# Patient Record
Sex: Male | Born: 1975 | Race: White | Hispanic: No | Marital: Single | Smoking: Current every day smoker
Health system: Southern US, Community
[De-identification: ages and names within clinical notes are randomized; demographics above are authoritative.]

---

## 2009-11-01 ENCOUNTER — Inpatient Hospital Stay (HOSPITAL_COMMUNITY): Admission: EM | Admit: 2009-11-01 | Discharge: 2009-11-05 | Payer: Self-pay | Admitting: Emergency Medicine

## 2010-05-08 LAB — LIPID PANEL
Total CHOL/HDL Ratio: 3.4 RATIO
VLDL: 20 mg/dL (ref 0–40)

## 2010-05-08 LAB — DIFFERENTIAL
Basophils Absolute: 0 10*3/uL (ref 0.0–0.1)
Basophils Absolute: 0 10*3/uL (ref 0.0–0.1)
Basophils Relative: 0 % (ref 0–1)
Eosinophils Absolute: 0 10*3/uL (ref 0.0–0.7)
Eosinophils Absolute: 0 10*3/uL (ref 0.0–0.7)
Eosinophils Absolute: 0 10*3/uL (ref 0.0–0.7)
Eosinophils Relative: 0 % (ref 0–5)
Eosinophils Relative: 0 % (ref 0–5)
Eosinophils Relative: 0 % (ref 0–5)
Eosinophils Relative: 1 % (ref 0–5)
Lymphocytes Relative: 3 % — ABNORMAL LOW (ref 12–46)
Lymphocytes Relative: 4 % — ABNORMAL LOW (ref 12–46)
Lymphs Abs: 0.5 10*3/uL — ABNORMAL LOW (ref 0.7–4.0)
Lymphs Abs: 0.6 10*3/uL — ABNORMAL LOW (ref 0.7–4.0)
Lymphs Abs: 0.7 10*3/uL (ref 0.7–4.0)
Monocytes Absolute: 0.1 10*3/uL (ref 0.1–1.0)
Monocytes Absolute: 0.4 10*3/uL (ref 0.1–1.0)
Monocytes Absolute: 1 10*3/uL (ref 0.1–1.0)
Monocytes Relative: 2 % — ABNORMAL LOW (ref 3–12)
Monocytes Relative: 2 % — ABNORMAL LOW (ref 3–12)
Neutro Abs: 13.5 10*3/uL — ABNORMAL HIGH (ref 1.7–7.7)

## 2010-05-08 LAB — BASIC METABOLIC PANEL
BUN: 6 mg/dL (ref 6–23)
CO2: 24 mEq/L (ref 19–32)
CO2: 25 mEq/L (ref 19–32)
Chloride: 106 mEq/L (ref 96–112)
Chloride: 109 mEq/L (ref 96–112)
Creatinine, Ser: 0.82 mg/dL (ref 0.4–1.5)
GFR calc Af Amer: >60 mL/min (ref >60)
GFR calc Af Amer: >60 mL/min (ref >60)
GFR calc non Af Amer: >60 mL/min (ref >60)
Glucose, Bld: 99 mg/dL (ref 70–99)
Potassium: 3.6 mEq/L (ref 3.5–5.1)
Potassium: 4 mEq/L (ref 3.5–5.1)
Potassium: 4.3 mEq/L (ref 3.5–5.1)
Sodium: 138 mEq/L (ref 135–145)

## 2010-05-08 LAB — CBC
HCT: 41 % (ref 39.0–52.0)
HCT: 44.1 % (ref 39.0–52.0)
HCT: 49.5 % (ref 39.0–52.0)
Hemoglobin: 14 g/dL (ref 13.0–17.0)
Hemoglobin: 15.1 g/dL (ref 13.0–17.0)
Hemoglobin: 15.4 g/dL (ref 13.0–17.0)
MCH: 32.4 pg (ref 26.0–34.0)
MCH: 32.5 pg (ref 26.0–34.0)
MCHC: 34.3 g/dL (ref 30.0–36.0)
MCV: 94.4 fL (ref 78.0–100.0)
MCV: 95.3 fL (ref 78.0–100.0)
Platelets: 214 10*3/uL (ref 150–400)
Platelets: 232 10*3/uL (ref 150–400)
RBC: 4.31 MIL/uL (ref 4.22–5.81)
RBC: 4.68 MIL/uL (ref 4.22–5.81)
RBC: 4.81 MIL/uL (ref 4.22–5.81)
RDW: 13.6 % (ref 11.5–15.5)
WBC: 11.1 10*3/uL — ABNORMAL HIGH (ref 4.0–10.5)
WBC: 18.5 10*3/uL — ABNORMAL HIGH (ref 4.0–10.5)
WBC: 19.7 10*3/uL — ABNORMAL HIGH (ref 4.0–10.5)

## 2010-05-08 LAB — GLUCOSE, CAPILLARY
Glucose-Capillary: 127 mg/dL — ABNORMAL HIGH (ref 70–99)
Glucose-Capillary: 138 mg/dL — ABNORMAL HIGH (ref 70–99)
Glucose-Capillary: 144 mg/dL — ABNORMAL HIGH (ref 70–99)
Glucose-Capillary: 151 mg/dL — ABNORMAL HIGH (ref 70–99)

## 2010-05-08 LAB — COMPREHENSIVE METABOLIC PANEL
ALT: 25 U/L (ref 0–53)
AST: 27 U/L (ref 0–37)
Albumin: 3.6 g/dL (ref 3.5–5.2)
CO2: 23 mEq/L (ref 19–32)
Chloride: 104 mEq/L (ref 96–112)
GFR calc Af Amer: >60 mL/min (ref >60)
GFR calc non Af Amer: >60 mL/min (ref >60)
Sodium: 134 mEq/L — ABNORMAL LOW (ref 135–145)
Total Bilirubin: 0.7 mg/dL (ref 0.3–1.2)

## 2010-05-08 LAB — TSH: TSH: 2.849 u[IU]/mL (ref 0.350–4.500)

## 2010-05-08 LAB — POCT CARDIAC MARKERS: Troponin i, poc: <0.05 ng/mL (ref 0.00–0.09)

## 2010-05-08 LAB — RAPID URINE DRUG SCREEN, HOSP PERFORMED
Barbiturates: NOT DETECTED
Benzodiazepines: POSITIVE — AB
Cocaine: NOT DETECTED
Opiates: NOT DETECTED

## 2013-10-24 ENCOUNTER — Emergency Department (HOSPITAL_COMMUNITY)
Admission: EM | Admit: 2013-10-24 | Discharge: 2013-10-24 | Disposition: A | Discharge status: Home / Self Care | Payer: Self-pay | Attending: Emergency Medicine | Admitting: Emergency Medicine

## 2013-10-24 ENCOUNTER — Encounter (HOSPITAL_COMMUNITY): Payer: Self-pay | Admitting: Emergency Medicine

## 2013-10-24 DIAGNOSIS — K089 Disorder of teeth and supporting structures, unspecified: Secondary | ICD-10-CM | POA: Insufficient documentation

## 2013-10-24 DIAGNOSIS — K006 Disturbances in tooth eruption: Secondary | ICD-10-CM | POA: Insufficient documentation

## 2013-10-24 DIAGNOSIS — K029 Dental caries, unspecified: Secondary | ICD-10-CM | POA: Insufficient documentation

## 2013-10-24 DIAGNOSIS — F17200 Nicotine dependence, unspecified, uncomplicated: Secondary | ICD-10-CM | POA: Insufficient documentation

## 2013-10-24 DIAGNOSIS — K0889 Other specified disorders of teeth and supporting structures: Secondary | ICD-10-CM

## 2013-10-24 MED ORDER — CLINDAMYCIN HCL 150 MG PO CAPS
300.0000 mg | ORAL_CAPSULE | Freq: Once | ORAL | Status: AC
  Administered 2013-10-24: 300 mg via ORAL
  Filled 2013-10-24: qty 2

## 2013-10-24 MED ORDER — CLINDAMYCIN HCL 150 MG PO CAPS: 150.0000 mg | ORAL_CAPSULE | Freq: Three times a day (TID) | ORAL | Status: DC

## 2013-10-24 MED ORDER — TRAMADOL HCL 50 MG PO TABS: ORAL_TABLET | ORAL | Status: DC

## 2013-10-24 MED ORDER — IBUPROFEN 800 MG PO TABS
800.0000 mg | ORAL_TABLET | Freq: Once | ORAL | Status: AC
  Administered 2013-10-24: 800 mg via ORAL
  Filled 2013-10-24: qty 1

## 2013-10-24 MED ORDER — ACETAMINOPHEN 325 MG PO TABS
650.0000 mg | ORAL_TABLET | Freq: Once | ORAL | Status: AC
  Administered 2013-10-24: 650 mg via ORAL
  Filled 2013-10-24: qty 2

## 2013-10-24 NOTE — ED Notes (Signed)
Dental pain rt mandibular region up into lt ear. For 2 weeks

## 2013-10-24 NOTE — ED Notes (Signed)
Pt c/o of pain lower right jaw with radiation to right jaw. Patient has broken wisdom tooth on that side which he states happened "around 5 years ago", patient alsostates the pain is not from that tooth but "the one in front of it, with the hole", cavity is seen on assessment. Patient states no dental care, he just "needs antibiotics and pain medication". Patient states Tmax 99, denies any other S&S of infection.

## 2013-10-24 NOTE — Care Management Note (Signed)
ED/CM noted patient did not have health insurance and/or PCP listed in the computer.  Patient was given the [place] resource handout with information on the clinics, food pantries, and the handout for new health insurance sign-up.  Patient expressed appreciation for information received. Pt was also given a Rx discount card.   

## 2013-10-24 NOTE — ED Provider Notes (Signed)
CSN: [id number]     Arrival date & time 10/24/13  1147 History   First MD Initiated Contact with Patient 10/24/13 1205     Chief Complaint  Patient presents with  . Dental Pain     (Consider location/radiation/quality/duration/timing/severity/associated sxs/prior Treatment) Patient is a 38 y.o. male presenting with tooth pain. The history is provided by the patient.  Dental Pain Location:  Lower Lower teeth location:  32/RL 3rd molar Quality:  Shooting and throbbing Severity:  Severe Onset quality:  Gradual Duration:  2 weeks Timing:  Intermittent Progression:  Worsening Chronicity:  Recurrent Context: dental caries and poor dentition   Context: not trauma   Relieved by:  Nothing Ineffective treatments:  NSAIDs Associated symptoms: facial pain and gum swelling   Associated symptoms: no fever, no neck pain and no oral bleeding   Risk factors: lack of dental care     History reviewed. No pertinent past medical history. History reviewed. No pertinent past surgical history. History reviewed. No pertinent family history. History  Substance Use Topics  . Smoking status: Current Every Day Smoker  . Smokeless tobacco: Not on file  . Alcohol Use: Yes    Review of Systems  Constitutional: Negative for fever and activity change.       All ROS Neg except as noted in HPI  HENT: Positive for dental problem. Negative for nosebleeds.   Eyes: Negative for photophobia and discharge.  Respiratory: Negative for cough, shortness of breath and wheezing.   Cardiovascular: Negative for chest pain and palpitations.  Gastrointestinal: Negative for abdominal pain and blood in stool.  Genitourinary: Negative for dysuria, frequency and hematuria.  Musculoskeletal: Negative for arthralgias, back pain and neck pain.  Skin: Negative.   Neurological: Negative for dizziness, seizures and speech difficulty.  Psychiatric/Behavioral: Negative for hallucinations and confusion.      Allergies   Review of patient's allergies indicates no known allergies.  Home Medications   Prior to Admission medications   Medication Sig Start Date End Date Taking? Authorizing Provider  ibuprofen (ADVIL,MOTRIN) 200 MG tablet Take 600 mg by mouth every 6 (six) hours as needed (dental pain).   Yes Historical Provider, MD   BP 108/77  Pulse 91  Temp(Src) 99 F (37.2 C) (Oral)  Resp 18  Ht  (1.702 m)  Wt 250 lb (113.399 kg)  BMI 39.15 kg/m2  SpO2 97% Physical Exam  Nursing note and vitals reviewed. Constitutional: He is oriented to person, place, and time. He appears well-developed and well-nourished.  Non-toxic appearance.  HENT:  Head: Normocephalic.  Right Ear: Tympanic membrane and external ear normal.  Left Ear: Tympanic membrane and external ear normal.  The right lower molar is decayed to the gum line. There is a portion of the tooth that is erupting through the gum. There is no visible abscess. There is no swelling under the tongue. The airway is patent.  Eyes: EOM and lids are normal. Pupils are equal, round, and reactive to light.  Neck: Normal range of motion. Neck supple. Carotid bruit is not present.  Cardiovascular: Normal rate, regular rhythm, normal heart sounds, intact distal pulses and normal pulses.   Pulmonary/Chest: Breath sounds normal. No respiratory distress.  Abdominal: Soft. Bowel sounds are normal. There is no tenderness. There is no guarding.  Musculoskeletal: Normal range of motion.  Lymphadenopathy:       Head (right side): No submandibular adenopathy present.       Head (left side): No submandibular adenopathy present.  He has no cervical adenopathy.  Neurological: He is alert and oriented to person, place, and time. He has normal strength. No cranial nerve deficit or sensory deficit.  Skin: Skin is warm and dry.  Psychiatric: He has a normal mood and affect. His speech is normal.    ED Course  Procedures (including critical care time) Labs  Review Labs Reviewed - No data to display  Imaging Review No results found.   EKG Interpretation None      MDM No evidence for Ludwig's angina. Temperature 99.9, vital signs otherwise within normal limits. The airway is patent.  Prescription for clindamycin and Ultram given to the patient. Patient is to see a dentist as soon as possible. Dental resources sheet given to the patient.    Final diagnoses:  None    *I have reviewed nursing notes, vital signs, and all appropriate lab and imaging results for this patient.Kathie Dike, PA-C 10/24/13 1434

## 2013-10-24 NOTE — Discharge Instructions (Signed)

## 2013-10-24 NOTE — ED Provider Notes (Signed)
Medical screening examination/treatment/procedure(s) were performed by non-physician practitioner and as supervising physician I was immediately available for consultation/collaboration.   EKG Interpretation None        Courtney Fenlon L Davaun Quintela, MD 10/24/13 1456 

## 2013-10-24 NOTE — ED Notes (Signed)
Information regarding free/reduced cost dental care given to patient.

## 2014-08-30 ENCOUNTER — Emergency Department (HOSPITAL_COMMUNITY): Payer: Self-pay

## 2014-08-30 ENCOUNTER — Emergency Department (HOSPITAL_COMMUNITY)
Admission: EM | Admit: 2014-08-30 | Discharge: 2014-08-30 | Disposition: A | Discharge status: Home / Self Care | Payer: Self-pay | Attending: Emergency Medicine | Admitting: Emergency Medicine

## 2014-08-30 ENCOUNTER — Encounter (HOSPITAL_COMMUNITY): Payer: Self-pay | Admitting: Emergency Medicine

## 2014-08-30 DIAGNOSIS — J40 Bronchitis, not specified as acute or chronic: Secondary | ICD-10-CM

## 2014-08-30 DIAGNOSIS — J4541 Moderate persistent asthma with (acute) exacerbation: Secondary | ICD-10-CM | POA: Insufficient documentation

## 2014-08-30 DIAGNOSIS — Z720 Tobacco use: Secondary | ICD-10-CM | POA: Insufficient documentation

## 2014-08-30 LAB — COMPREHENSIVE METABOLIC PANEL
ALK PHOS: 67 U/L (ref 38–126)
ALT: 26 U/L (ref 17–63)
ANION GAP: 10 (ref 5–15)
AST: 28 U/L (ref 15–41)
Albumin: 4.2 g/dL (ref 3.5–5.0)
BILIRUBIN TOTAL: 0.8 mg/dL (ref 0.3–1.2)
BUN: 7 mg/dL (ref 6–20)
CHLORIDE: 105 mmol/L (ref 101–111)
CO2: 23 mmol/L (ref 22–32)
Calcium: 8.7 mg/dL — ABNORMAL LOW (ref 8.9–10.3)
Creatinine, Ser: 1.04 mg/dL (ref 0.61–1.24)
GFR calc Af Amer: >60 mL/min (ref >60)
GFR calc non Af Amer: >60 mL/min (ref >60)
GLUCOSE: 101 mg/dL — AB (ref 65–99)
POTASSIUM: 3.6 mmol/L (ref 3.5–5.1)
Sodium: 138 mmol/L (ref 135–145)
TOTAL PROTEIN: 7.4 g/dL (ref 6.5–8.1)

## 2014-08-30 LAB — CBC WITH DIFFERENTIAL/PLATELET
BASOS PCT: 1 % (ref 0–1)
Basophils Absolute: 0 10*3/uL (ref 0.0–0.1)
Eosinophils Absolute: 0.1 10*3/uL (ref 0.0–0.7)
Eosinophils Relative: 3 % (ref 0–5)
HEMATOCRIT: 49.8 % (ref 39.0–52.0)
HEMOGLOBIN: 17.6 g/dL — AB (ref 13.0–17.0)
LYMPHS ABS: 1.8 10*3/uL (ref 0.7–4.0)
Lymphocytes Relative: 31 % (ref 12–46)
MCH: 31.5 pg (ref 26.0–34.0)
MCHC: 35.3 g/dL (ref 30.0–36.0)
MCV: 89.1 fL (ref 78.0–100.0)
MONO ABS: 0.7 10*3/uL (ref 0.1–1.0)
MONOS PCT: 12 % (ref 3–12)
NEUTROS ABS: 3 10*3/uL (ref 1.7–7.7)
Neutrophils Relative %: 53 % (ref 43–77)
Platelets: 186 10*3/uL (ref 150–400)
RBC: 5.59 MIL/uL (ref 4.22–5.81)
RDW: 12.9 % (ref 11.5–15.5)
WBC: 5.6 10*3/uL (ref 4.0–10.5)

## 2014-08-30 MED ORDER — PREDNISONE 10 MG PO TABS
60.0000 mg | ORAL_TABLET | Freq: Every day | ORAL | Status: DC
  Administered 2014-08-30: 60 mg via ORAL
  Filled 2014-08-30 (×2): qty 1

## 2014-08-30 MED ORDER — ALBUTEROL SULFATE (2.5 MG/3ML) 0.083% IN NEBU
5.0000 mg | INHALATION_SOLUTION | Freq: Once | RESPIRATORY_TRACT | Status: AC
  Administered 2014-08-30: 5 mg via RESPIRATORY_TRACT
  Filled 2014-08-30: qty 6

## 2014-08-30 MED ORDER — AZITHROMYCIN 250 MG PO TABS: ORAL_TABLET | ORAL | Status: DC

## 2014-08-30 MED ORDER — ALBUTEROL (5 MG/ML) CONTINUOUS INHALATION SOLN
10.0000 mg/h | INHALATION_SOLUTION | Freq: Once | RESPIRATORY_TRACT | Status: AC
  Administered 2014-08-30: 10 mg/h via RESPIRATORY_TRACT
  Filled 2014-08-30: qty 20

## 2014-08-30 MED ORDER — METHYLPREDNISOLONE SODIUM SUCC 125 MG IJ SOLR
125.0000 mg | Freq: Once | INTRAMUSCULAR | Status: AC
  Administered 2014-08-30: 125 mg via INTRAVENOUS
  Filled 2014-08-30: qty 2

## 2014-08-30 MED ORDER — SODIUM CHLORIDE 0.9 % IV SOLN
Freq: Once | INTRAVENOUS | Status: AC
  Administered 2014-08-30: 16:00:00 via INTRAVENOUS

## 2014-08-30 MED ORDER — IPRATROPIUM-ALBUTEROL 0.5-2.5 (3) MG/3ML IN SOLN
3.0000 mL | Freq: Once | RESPIRATORY_TRACT | Status: AC
  Administered 2014-08-30: 3 mL via RESPIRATORY_TRACT
  Filled 2014-08-30: qty 3

## 2014-08-30 MED ORDER — ALBUTEROL SULFATE HFA 108 (90 BASE) MCG/ACT IN AERS: 2.0000 | INHALATION_SPRAY | RESPIRATORY_TRACT | Status: DC | PRN

## 2014-08-30 MED ORDER — PREDNISONE 10 MG PO TABS: ORAL_TABLET | ORAL | Status: DC

## 2014-08-30 NOTE — Discharge Instructions (Signed)
Asthma Attack Prevention °Although there is no way to prevent asthma from starting, you can take steps to control the disease and reduce its symptoms. Learn about your asthma and how to control it. Take an active role to control your asthma by working with your health care provider to create and follow an asthma action plan. An asthma action plan guides you in: °· Taking your medicines properly. °· Avoiding things that set off your asthma or make your asthma worse (asthma triggers). °· Tracking your level of asthma control. °· Responding to worsening asthma. °· Seeking emergency care when needed. °To track your asthma, keep records of your symptoms, check your peak flow number using a handheld device that shows how well air moves out of your lungs (peak flow meter), and get regular asthma checkups.  °WHAT ARE SOME WAYS TO PREVENT AN ASTHMA ATTACK? °· Take medicines as directed by your health care provider. °· Keep track of your asthma symptoms and level of control. °· With your health care provider, write a detailed plan for taking medicines and managing an asthma attack. Then be sure to follow your action plan. Asthma is an ongoing condition that needs regular monitoring and treatment. °· Identify and avoid asthma triggers. Many outdoor allergens and irritants (such as pollen, mold, cold air, and air pollution) can trigger asthma attacks. Find out what your asthma triggers are and take steps to avoid them. °· Monitor your breathing. Learn to recognize warning signs of an attack, such as coughing, wheezing, or shortness of breath. Your lung function may decrease before you notice any signs or symptoms, so regularly measure and record your peak airflow with a home peak flow meter. °· Identify and treat attacks early. If you act quickly, you are less likely to have a severe attack. You will also need less medicine to control your symptoms. When your peak flow measurements decrease and alert you to an upcoming attack,  take your medicine as instructed and immediately stop any activity that may have triggered the attack. If your symptoms do not improve, get medical help. °· Pay attention to increasing quick-relief inhaler use. If you find yourself relying on your quick-relief inhaler, your asthma is not under control. See your health care provider about adjusting your treatment. °WHAT CAN MAKE MY SYMPTOMS WORSE? °A number of common things can set off or make your asthma symptoms worse and cause temporary increased inflammation of your airways. Keep track of your asthma symptoms for several weeks, detailing all the environmental and emotional factors that are linked with your asthma. When you have an asthma attack, go back to your asthma diary to see which factor, or combination of factors, might have contributed to it. Once you know what these factors are, you can take steps to control many of them. If you have allergies and asthma, it is important to take asthma prevention steps at home. Minimizing contact with the substance to which you are allergic will help prevent an asthma attack. Some triggers and ways to avoid these triggers are: °Animal Dander:  °Some people are allergic to the flakes of skin or dried saliva from animals with fur or feathers.  °· There is no such thing as a hypoallergenic dog or cat breed. All dogs or cats can cause allergies, even if they don't shed. °· Keep these pets out of your home. °· If you are not able to keep a pet outdoors, keep the pet out of your bedroom and other sleeping areas at all   times, and keep the door closed. °· Remove carpets and furniture covered with cloth from your home. If that is not possible, keep the pet away from fabric-covered furniture and carpets. °Dust Mites: °Many people with asthma are allergic to dust mites. Dust mites are tiny bugs that are found in every home in mattresses, pillows, carpets, fabric-covered furniture, bedcovers, clothes, stuffed toys, and other  fabric-covered items.  °· Cover your mattress in a special dust-proof cover. °· Cover your pillow in a special dust-proof cover, or wash the pillow each week in hot water. Water must be hotter than 130° F (54.4° C) to kill dust mites. Cold or warm water used with detergent and bleach can also be effective. °· Wash the sheets and blankets on your bed each week in hot water. °· Try not to sleep or lie on cloth-covered cushions. °· Call ahead when traveling and ask for a smoke-free hotel room. Bring your own bedding and pillows in case the hotel only supplies feather pillows and down comforters, which may contain dust mites and cause asthma symptoms. °· Remove carpets from your bedroom and those laid on concrete, if you can. °· Keep stuffed toys out of the bed, or wash the toys weekly in hot water or cooler water with detergent and bleach. °Cockroaches: °Many people with asthma are allergic to the droppings and remains of cockroaches.  °· Keep food and garbage in closed containers. Never leave food out. °· Use poison baits, traps, powders, gels, or paste (for example, boric acid). °· If a spray is used to kill cockroaches, stay out of the room until the odor goes away. °Indoor Mold: °· Fix leaky faucets, pipes, or other sources of water that have mold around them. °· Clean floors and moldy surfaces with a fungicide or diluted bleach. °· Avoid using humidifiers, vaporizers, or swamp coolers. These can spread molds through the air. °Pollen and Outdoor Mold: °· When pollen or mold spore counts are high, try to keep your windows closed. °· Stay indoors with windows closed from late morning to afternoon. Pollen and some mold spore counts are highest at that time. °· Ask your health care provider whether you need to take anti-inflammatory medicine or increase your dose of the medicine before your allergy season starts. °Other Irritants to Avoid: °· Tobacco smoke is an irritant. If you smoke, ask your health care provider how  you can quit. Ask family members to quit smoking, too. Do not allow smoking in your home or car. °· If possible, do not use a wood-burning stove, kerosene heater, or fireplace. Minimize exposure to all sources of smoke, including incense, candles, fires, and fireworks. °· Try to stay away from strong odors and sprays, such as perfume, talcum powder, hair spray, and paints. °· Decrease humidity in your home and use an indoor air cleaning device. Reduce indoor humidity to below 60%. Dehumidifiers or central air conditioners can do this. °· Decrease house dust exposure by changing furnace and air cooler filters frequently. °· Try to have someone else vacuum for you once or twice a week. Stay out of rooms while they are being vacuumed and for a short while afterward. °· If you vacuum, use a dust mask from a hardware store, a double-layered or microfilter vacuum cleaner bag, or a vacuum cleaner with a HEPA filter. °· Sulfites in foods and beverages can be irritants. Do not drink beer or wine or eat dried fruit, processed potatoes, or shrimp if they cause asthma symptoms. °· Cold   air can trigger an asthma attack. Cover your nose and mouth with a scarf on cold or windy days. °· Several health conditions can make asthma more difficult to manage, including a runny nose, sinus infections, reflux disease, psychological stress, and sleep apnea. Work with your health care provider to manage these conditions. °· Avoid close contact with people who have a respiratory infection such as a cold or the flu, since your asthma symptoms may get worse if you catch the infection. Wash your hands thoroughly after touching items that may have been handled by people with a respiratory infection. °· Get a flu shot every year to protect against the flu virus, which often makes asthma worse for days or weeks. Also get a pneumonia shot if you have not previously had one. Unlike the flu shot, the pneumonia shot does not need to be given  yearly. °Medicines: °· Talk to your health care provider about whether it is safe for you to take aspirin or non-steroidal anti-inflammatory medicines (NSAIDs). In a small number of people with asthma, aspirin and NSAIDs can cause asthma attacks. These medicines must be avoided by people who have known aspirin-sensitive asthma. It is important that people with aspirin-sensitive asthma read labels of all over-the-counter medicines used to treat pain, colds, coughs, and fever. °· Beta-blockers and ACE inhibitors are other medicines you should discuss with your health care provider. °HOW CAN I FIND OUT WHAT I AM ALLERGIC TO? °Ask your asthma health care provider about allergy skin testing or blood testing (the RAST test) to identify the allergens to which you are sensitive. If you are found to have allergies, the most important thing to do is to try to avoid exposure to any allergens that you are sensitive to as much as possible. Other treatments for allergies, such as medicines and allergy shots (immunotherapy) are available.  °CAN I EXERCISE? °Follow your health care provider's advice regarding asthma treatment before exercising. It is important to maintain a regular exercise program, but vigorous exercise or exercise in cold, humid, or dry environments can cause asthma attacks, especially for those people who have exercise-induced asthma. °Document Released: 01/28/2009 Document Revised: 02/14/2013 Document Reviewed: 08/17/2012 °Exit[employer]® Patient Information ©2015 [employer]. This information is not intended to replace advice given to you by your health care provider. Make sure you discuss any questions you have with your health care provider. ° °Asthma °Asthma is a recurring condition in which the airways tighten and narrow. Asthma can make it difficult to breathe. It can cause coughing, wheezing, and shortness of breath. Asthma episodes, also called asthma attacks, range from minor to life-threatening. Asthma  cannot be cured, but medicines and lifestyle changes can help control it. °CAUSES °Asthma is believed to be caused by inherited (genetic) and environmental factors, but its exact cause is unknown. Asthma may be triggered by allergens, lung infections, or irritants in the air. Asthma triggers are different for each person. Common triggers include:  °· Animal dander. °· Dust mites. °· Cockroaches. °· Pollen from trees or grass. °· Mold. °· Smoke. °· Air pollutants such as dust, household cleaners, hair sprays, aerosol sprays, paint fumes, strong chemicals, or strong odors. °· Cold air, weather changes, and winds (which increase molds and pollens in the air). °· Strong emotional expressions such as crying or laughing hard. °· Stress. °· Certain medicines (such as aspirin) or types of drugs (such as beta-blockers). °· Sulfites in foods and drinks. Foods and drinks that may contain sulfites include dried fruit, potato   chips, and sparkling grape juice. °· Infections or inflammatory conditions such as the flu, a cold, or an inflammation of the nasal membranes (rhinitis). °· Gastroesophageal reflux disease (GERD). °· Exercise or strenuous activity. °SYMPTOMS °Symptoms may occur immediately after asthma is triggered or many hours later. Symptoms include: °· Wheezing. °· Excessive nighttime or early morning coughing. °· Frequent or severe coughing with a common cold. °· Chest tightness. °· Shortness of breath. °DIAGNOSIS  °The diagnosis of asthma is made by a review of your medical history and a physical exam. Tests may also be performed. These may include: °· Lung function studies. These tests show how much air you breathe in and out. °· Allergy tests. °· Imaging tests such as X-rays. °TREATMENT  °Asthma cannot be cured, but it can usually be controlled. Treatment involves identifying and avoiding your asthma triggers. It also involves medicines. There are 2 classes of medicine used for asthma treatment:  °· Controller  medicines. These prevent asthma symptoms from occurring. They are usually taken every day. °· Reliever or rescue medicines. These quickly relieve asthma symptoms. They are used as needed and provide short-term relief. °Your health care provider will help you create an asthma action plan. An asthma action plan is a written plan for managing and treating your asthma attacks. It includes a list of your asthma triggers and how they may be avoided. It also includes information on when medicines should be taken and when their dosage should be changed. An action plan may also involve the use of a device called a peak flow meter. A peak flow meter measures how well the lungs are working. It helps you monitor your condition. °HOME CARE INSTRUCTIONS  °· Take medicines only as directed by your health care provider. Speak with your health care provider if you have questions about how or when to take the medicines. °· Use a peak flow meter as directed by your health care provider. Record and keep track of readings. °· Understand and use the action plan to help minimize or stop an asthma attack without needing to seek medical care. °· Control your home environment in the following ways to help prevent asthma attacks: °¨ Do not smoke. Avoid being exposed to secondhand smoke. °¨ Change your heating and air conditioning filter regularly. °¨ Limit your use of fireplaces and wood stoves. °¨ Get rid of pests (such as roaches and mice) and their droppings. °¨ Throw away plants if you see mold on them. °¨ Clean your floors and dust regularly. Use unscented cleaning products. °¨ Try to have someone else vacuum for you regularly. Stay out of rooms while they are being vacuumed and for a short while afterward. If you vacuum, use a dust mask from a hardware store, a double-layered or microfilter vacuum cleaner bag, or a vacuum cleaner with a HEPA filter. °¨ Replace carpet with wood, tile, or vinyl flooring. Carpet can trap dander and  dust. °¨ Use allergy-proof pillows, mattress covers, and box spring covers. °¨ Wash bed sheets and blankets every week in hot water and dry them in a dryer. °¨ Use blankets that are made of polyester or cotton. °¨ Clean bathrooms and kitchens with bleach. If possible, have someone repaint the walls in these rooms with mold-resistant paint. Keep out of the rooms that are being cleaned and painted. °¨ Wash hands frequently. °SEEK MEDICAL CARE IF:  °· You have wheezing, shortness of breath, or a cough even if taking medicine to prevent attacks. °· The colored mucus   you cough up (sputum) is thicker than usual. °· Your sputum changes from clear or white to yellow, green, gray, or bloody. °· You have any problems that may be related to the medicines you are taking (such as a rash, itching, swelling, or trouble breathing). °· You are using a reliever medicine more than 2-3 times per week. °· Your peak flow is still at 50-79% of your personal best after following your action plan for 1 hour. °· You have a fever. °SEEK IMMEDIATE MEDICAL CARE IF:  °· You seem to be getting worse and are unresponsive to treatment during an asthma attack. °· You are short of breath even at rest. °· You get short of breath when doing very little physical activity. °· You have difficulty eating, drinking, or talking due to asthma symptoms. °· You develop chest pain. °· You develop a fast heartbeat. °· You have a bluish color to your lips or fingernails. °· You are light-headed, dizzy, or faint. °· Your peak flow is less than 50% of your personal best. °MAKE SURE YOU:  °· Understand these instructions. °· Will watch your condition. °· Will get help right away if you are not doing well or get worse. °Document Released: 02/09/2005 Document Revised: 06/26/2013 Document Reviewed: 09/08/2012 °Exit[employer]® Patient Information ©2015 [employer]. This information is not intended to replace advice given to you by your health care provider. Make sure you  discuss any questions you have with your health care provider. ° °

## 2014-08-30 NOTE — ED Notes (Signed)
Started with cold symptoms July 4th - cough, congestion. Sputum-- starting to turn brownish in color

## 2014-08-30 NOTE — ED Provider Notes (Signed)
CSN: [id number]     Arrival date & time 08/30/14  1130 History   First MD Initiated Contact with Patient 08/30/14 1307     Chief Complaint  Patient presents with  . Cough     (Consider location/radiation/quality/duration/timing/severity/associated sxs/prior Treatment) Patient is a 39 y.o. male presenting with cough. The history is provided by the patient. No language interpreter was used.  Cough Cough characteristics:  Non-productive Severity:  Moderate Onset quality:  Gradual Duration:  3 days Timing:  Constant Progression:  Worsening Chronicity:  New Smoker: no   Context: upper respiratory infection   Context: not sick contacts   Relieved by:  Nothing Worsened by:  Nothing tried Ineffective treatments:  None tried Associated symptoms: shortness of breath     History reviewed. No pertinent past medical history. History reviewed. No pertinent past surgical history. No family history on file. History  Substance Use Topics  . Smoking status: Current Every Day Smoker -- 1.00 packs/day for 1 years    Types: Cigarettes  . Smokeless tobacco: Never Used  . Alcohol Use: Yes     Comment: occ    Review of Systems  Respiratory: Positive for cough and shortness of breath.   All other systems reviewed and are negative.     Allergies  Review of patient's allergies indicates no known allergies.  Home Medications   Prior to Admission medications   Medication Sig Start Date End Date Taking? Authorizing Provider  ibuprofen (ADVIL,MOTRIN) 200 MG tablet Take 600 mg by mouth every 6 (six) hours as needed (dental pain).   Yes Historical Provider, MD   BP 100/67 mmHg  Pulse 87  Temp(Src) 98.1 F (36.7 C) (Oral)  Resp 16  Ht 5\' 6"  (1.676 m)  Wt 210 lb (95.255 kg)  BMI 33.91 kg/m2  SpO2 98% Physical Exam  Constitutional: He is oriented to person, place, and time. He appears well-developed and well-nourished.  HENT:  Head: Normocephalic.  Right Ear: External ear normal.   Left Ear: External ear normal.  Nose: Nose normal.  Mouth/Throat: Oropharynx is clear and moist.  Eyes: Conjunctivae and EOM are normal. Pupils are equal, round, and reactive to light.  Neck: Normal range of motion.  Cardiovascular: Normal rate and normal heart sounds.   Pulmonary/Chest: Effort normal. He has wheezes.  Abdominal: Soft. He exhibits no distension.  Musculoskeletal: Normal range of motion.  Neurological: He is alert and oriented to person, place, and time.  Skin: Skin is warm.  Psychiatric: He has a normal mood and affect.  Nursing note and vitals reviewed.   ED Course  Procedures (including critical care time) Labs Review Labs Reviewed - No data to display  Imaging Review Dg Chest 2 View  08/30/2014   CLINICAL DATA:  Cough starting July 4th  EXAM: CHEST  2 VIEW  COMPARISON:  11/01/2009  FINDINGS: Cardiomediastinal silhouette is stable. No acute infiltrate or pleural effusion. No pulmonary edema. Bony thorax is unremarkable.  IMPRESSION: No active cardiopulmonary disease.   Electronically Signed   By: Natasha MeadLiviu  Pop M.D.   On: 08/30/2014 13:28     EKG Interpretation None      MDM pt given duo neb then 5mg  neb.   Pt has continued wheezing.  Pt given iv solumedrol and 1 hour continous neb.  Pt reports he feels much better.   Final diagnoses:  Asthma, moderate persistent, with acute exacerbation  Bronchitis   Meds ordered this encounter  Medications  . ipratropium-albuterol (DUONEB) 0.5-2.5 (3) MG/3ML nebulizer solution  3 mL    Sig:   . predniSONE (DELTASONE) tablet 60 mg    Sig:   . albuterol (PROVENTIL) (2.5 MG/3ML) 0.083% nebulizer solution 5 mg    Sig:   . 0.9 %  sodium chloride infusion    Sig:   . methylPREDNISolone sodium succinate (SOLU-MEDROL) 125 mg/2 mL injection 125 mg    Sig:   . albuterol (PROVENTIL,VENTOLIN) solution continuous neb    Sig:   . albuterol (PROVENTIL HFA;VENTOLIN HFA) 108 (90 BASE) MCG/ACT inhaler    Sig: Inhale 2 puffs into the  lungs every 4 (four) hours as needed for wheezing or shortness of breath.    Dispense:  1 Inhaler    Refill:  0    Order Specific Question:  Supervising Provider    Answer:  MILLER, BRIAN [[id number]]  . predniSONE (DELTASONE) 10 MG tablet    Sig: 1,6,1,0,96 taper    Dispense:  21 tablet    Refill:  0    Order Specific Question:  Supervising Provider    Answer:  Hyacinth Meeker, BRIAN [[id number]]  . azithromycin (ZITHROMAX Z-PAK) 250 MG tablet    Sig: 2 tablets 1st day then 1 tablet days 2-5    Dispense:  6 tablet    Refill:  0    Order Specific Question:  Supervising Provider    Answer:  Eber Hong [[id number]]    Lonia Skinner [place], PA-C 08/30/14 1702  Bethann Berkshire, MD 08/31/14 [phone]

## 2015-04-30 ENCOUNTER — Emergency Department (HOSPITAL_COMMUNITY)
Admission: EM | Admit: 2015-04-30 | Discharge: 2015-04-30 | Disposition: A | Discharge status: Home / Self Care | Payer: Self-pay | Attending: Emergency Medicine | Admitting: Emergency Medicine

## 2015-04-30 ENCOUNTER — Encounter (HOSPITAL_COMMUNITY): Payer: Self-pay | Admitting: Emergency Medicine

## 2015-04-30 ENCOUNTER — Emergency Department (HOSPITAL_COMMUNITY): Payer: Self-pay

## 2015-04-30 DIAGNOSIS — N201 Calculus of ureter: Secondary | ICD-10-CM | POA: Insufficient documentation

## 2015-04-30 DIAGNOSIS — F17210 Nicotine dependence, cigarettes, uncomplicated: Secondary | ICD-10-CM | POA: Insufficient documentation

## 2015-04-30 LAB — CBC
HEMATOCRIT: 48.4 % (ref 39.0–52.0)
Hemoglobin: 16.9 g/dL (ref 13.0–17.0)
MCH: 31.2 pg (ref 26.0–34.0)
MCHC: 34.9 g/dL (ref 30.0–36.0)
MCV: 89.3 fL (ref 78.0–100.0)
PLATELETS: 283 10*3/uL (ref 150–400)
RBC: 5.42 MIL/uL (ref 4.22–5.81)
RDW: 13.2 % (ref 11.5–15.5)
WBC: 16 10*3/uL — AB (ref 4.0–10.5)

## 2015-04-30 LAB — COMPREHENSIVE METABOLIC PANEL
ALT: 21 U/L (ref 17–63)
ANION GAP: 10 (ref 5–15)
AST: 20 U/L (ref 15–41)
Albumin: 4.4 g/dL (ref 3.5–5.0)
Alkaline Phosphatase: 63 U/L (ref 38–126)
BUN: 11 mg/dL (ref 6–20)
CALCIUM: 9.2 mg/dL (ref 8.9–10.3)
CHLORIDE: 104 mmol/L (ref 101–111)
CO2: 24 mmol/L (ref 22–32)
CREATININE: 1.18 mg/dL (ref 0.61–1.24)
GFR calc non Af Amer: >60 mL/min (ref >60)
Glucose, Bld: 114 mg/dL — ABNORMAL HIGH (ref 65–99)
Potassium: 3.9 mmol/L (ref 3.5–5.1)
SODIUM: 138 mmol/L (ref 135–145)
TOTAL PROTEIN: 7.3 g/dL (ref 6.5–8.1)
Total Bilirubin: 0.5 mg/dL (ref 0.3–1.2)

## 2015-04-30 LAB — URINALYSIS, ROUTINE W REFLEX MICROSCOPIC
BILIRUBIN URINE: NEGATIVE
Glucose, UA: NEGATIVE mg/dL
KETONES UR: NEGATIVE mg/dL
LEUKOCYTES UA: NEGATIVE
NITRITE: NEGATIVE
PH: 6 (ref 5.0–8.0)
PROTEIN: NEGATIVE mg/dL
Specific Gravity, Urine: 1.02 (ref 1.005–1.030)

## 2015-04-30 LAB — LIPASE, BLOOD: LIPASE: 26 U/L (ref 11–51)

## 2015-04-30 LAB — URINE MICROSCOPIC-ADD ON

## 2015-04-30 MED ORDER — CEPHALEXIN 500 MG PO CAPS: 500.0000 mg | ORAL_CAPSULE | Freq: Three times a day (TID) | ORAL | Status: DC

## 2015-04-30 MED ORDER — KETOROLAC TROMETHAMINE 30 MG/ML IJ SOLN
30.0000 mg | Freq: Once | INTRAMUSCULAR | Status: AC
  Administered 2015-04-30: 30 mg via INTRAVENOUS
  Filled 2015-04-30: qty 1

## 2015-04-30 MED ORDER — ONDANSETRON HCL 4 MG/2ML IJ SOLN
4.0000 mg | Freq: Once | INTRAMUSCULAR | Status: AC
  Administered 2015-04-30: 4 mg via INTRAVENOUS
  Filled 2015-04-30: qty 2

## 2015-04-30 MED ORDER — IOHEXOL 300 MG/ML  SOLN
50.0000 mL | Freq: Once | INTRAMUSCULAR | Status: AC | PRN
  Administered 2015-04-30: 50 mL via ORAL

## 2015-04-30 MED ORDER — OXYCODONE-ACETAMINOPHEN 5-325 MG PO TABS: 1.0000 | ORAL_TABLET | ORAL | Status: DC | PRN

## 2015-04-30 MED ORDER — ONDANSETRON 8 MG PO TBDP: 8.0000 mg | ORAL_TABLET | Freq: Three times a day (TID) | ORAL | Status: DC | PRN

## 2015-04-30 MED ORDER — IOHEXOL 300 MG/ML  SOLN
100.0000 mL | Freq: Once | INTRAMUSCULAR | Status: AC | PRN
  Administered 2015-04-30: 100 mL via INTRAVENOUS

## 2015-04-30 MED ORDER — CEFTRIAXONE SODIUM 1 G IJ SOLR
1.0000 g | Freq: Once | INTRAMUSCULAR | Status: AC
  Administered 2015-04-30: 1 g via INTRAVENOUS
  Filled 2015-04-30: qty 10

## 2015-04-30 MED ORDER — HYDROMORPHONE HCL 1 MG/ML IJ SOLN
1.0000 mg | Freq: Once | INTRAMUSCULAR | Status: AC
  Administered 2015-04-30: 1 mg via INTRAVENOUS
  Filled 2015-04-30: qty 1

## 2015-04-30 MED ORDER — IBUPROFEN 600 MG PO TABS: 600.0000 mg | ORAL_TABLET | Freq: Three times a day (TID) | ORAL | Status: DC | PRN

## 2015-04-30 NOTE — ED Notes (Signed)
Pt states NPO for solids 10am - reports he had 4oz water here in waiting room.

## 2015-04-30 NOTE — ED Provider Notes (Signed)
CSN: [id number]     Arrival date & time 04/30/15  1702 History   First MD Initiated Contact with Patient 04/30/15 1809     Chief Complaint  Patient presents with  . Abdominal Pain     HPI Patient reports developing right lower quadrant abdominal pain which is been worsening as the day went on.  He reports chills without documented fever.  Reports nausea without vomiting.  Denies diarrhea.  Denies flank pain.  No dysuria or urinary frequency.  No groin or testicular pain.  Pain is moderate in severity and worse with movement and palpation of his right lower quadrant.   History reviewed. No pertinent past medical history. History reviewed. No pertinent past surgical history. No family history on file. Social History  Substance Use Topics  . Smoking status: Current Every Day Smoker -- 1.00 packs/day for 1 years    Types: Cigarettes  . Smokeless tobacco: Never Used  . Alcohol Use: Yes     Comment: occ    Review of Systems  All other systems reviewed and are negative.     Allergies  Review of patient's allergies indicates no known allergies.  Home Medications   Prior to Admission medications   Medication Sig Start Date End Date Taking? Authorizing Provider  albuterol (PROVENTIL HFA;VENTOLIN HFA) 108 (90 BASE) MCG/ACT inhaler Inhale 2 puffs into the lungs every 4 (four) hours as needed for wheezing or shortness of breath. 08/30/14   Elson AreasLeslie K Sofia, PA-C  azithromycin (ZITHROMAX Z-PAK) 250 MG tablet 2 tablets 1st day then 1 tablet days 2-5 08/30/14   Elson AreasLeslie K Sofia, PA-C  ibuprofen (ADVIL,MOTRIN) 200 MG tablet Take 600 mg by mouth every 6 (six) hours as needed (dental pain).    Historical Provider, MD  predniSONE (DELTASONE) 10 MG tablet 1,9,1,4,786,5,4,3,21 taper 08/30/14   Lonia SkinnerLeslie K Sofia, PA-C   BP 129/82 mmHg  Pulse 92  Temp(Src) 97.7 F (36.5 C) (Oral)  Resp 18  Ht 5\' 6"  (1.676 m)  Wt 210 lb (95.255 kg)  BMI 33.91 kg/m2  SpO2 100% Physical Exam  Constitutional: He is oriented to  person, place, and time. He appears well-developed and well-nourished.  HENT:  Head: Normocephalic and atraumatic.  Eyes: EOM are normal.  Neck: Normal range of motion.  Cardiovascular: Normal rate, regular rhythm, normal heart sounds and intact distal pulses.   Pulmonary/Chest: Effort normal and breath sounds normal. No respiratory distress.  Right lower quadrant tenderness.  No guarding or rebound.  Abdominal: Soft. He exhibits no distension.  Musculoskeletal: Normal range of motion.  Neurological: He is alert and oriented to person, place, and time.  Skin: Skin is warm and dry.  Psychiatric: He has a normal mood and affect. Judgment normal.  Nursing note and vitals reviewed.   ED Course  Procedures (including critical care time) Labs Review Labs Reviewed  COMPREHENSIVE METABOLIC PANEL - Abnormal; Notable for the following:    Glucose, Bld 114 (*)    All other components within normal limits  CBC - Abnormal; Notable for the following:    WBC 16.0 (*)    All other components within normal limits  URINALYSIS, ROUTINE W REFLEX MICROSCOPIC (NOT AT [facility]) - Abnormal; Notable for the following:    APPearance HAZY (*)    Hgb urine dipstick LARGE (*)    All other components within normal limits  URINE MICROSCOPIC-ADD ON - Abnormal; Notable for the following:    Squamous Epithelial / LPF 0-5 (*)    Bacteria, UA MANY (*)  All other components within normal limits  URINE CULTURE  LIPASE, BLOOD    Imaging Review Ct Abdomen Pelvis W Contrast  04/30/2015  CLINICAL DATA:  Right lower quadrant pain EXAM: CT ABDOMEN AND PELVIS WITH CONTRAST TECHNIQUE: Multidetector CT imaging of the abdomen and pelvis was performed using the standard protocol following bolus administration of intravenous contrast. CONTRAST:  50mL OMNIPAQUE IOHEXOL 300 MG/ML SOLN, OMNIPAQUE IOHEXOL 300 MG/ML SOLN COMPARISON:  None. FINDINGS: Lower chest:  Lung bases clear.  Heart size normal. Hepatobiliary: Liver normal  in size and contour. No focal liver lesion. Gallbladder and bile ducts normal. Pancreas: Negative Spleen: Negative Adrenals/Urinary Tract: Normal renal excretion of contrast bilaterally. Negative for renal mass. 2 x 3 mm stone in the mid right ureter at the level of L4-5 causing mild obstruction. No other renal calculi. Urinary bladder normal. Stomach/Bowel: Negative for bowel obstruction. No bowel mass or edema. Normal appendix. Mild sigmoid diverticulosis. Vascular/Lymphatic: Normal aorta and iliac arteries. No aneurysm. Negative for lymphadenopathy. Reproductive: Normal prostate. Other: No free fluid Musculoskeletal: Negative IMPRESSION: 2 x 3 mm stone right mid ureter causing mild obstruction. Normal appendix. Electronically Signed   By: Marlan Palau M.D.   On: 04/30/2015 19:26   I have personally reviewed and evaluated these images and lab results as part of my medical decision-making.   EKG Interpretation None      MDM   Final diagnoses:  Right ureteral stone    Concerning for early appendicitis.  Patient will undergo labs and CT imaging at this time.  Pain treated.  Nothing by mouth.  Mid right ureteral stone.  Patient does have some bacteria although negative for nitrites and negative leukocytes.  Patient will be covered with Rocephin and Keflex.  My suspicion is low that this is truly an infected right ureteral stone.  I suspect right ureteral stone with generalized colic.  Patient understands return the emergency department for new or worsening symptoms.  Outpatient urology follow-up.    Azalia Bilis, MD 04/30/15 2115

## 2015-04-30 NOTE — Discharge Instructions (Signed)
Renal Colic  Renal colic is pain that is caused by passing a kidney stone. The pain can be sharp and severe. It may be felt in the back, abdomen, side (flank), or groin. It can cause nausea. Renal colic can come and go.  HOME CARE INSTRUCTIONS  Watch your condition for any changes. The following actions may help to lessen any discomfort that you are feeling:  · Take medicines only as directed by your health care provider.  · Ask your health care provider if it is okay to take over-the-counter pain medicine.  · Drink enough fluid to keep your urine clear or pale yellow. Drink 6-8 glasses of water each day.  · Limit the amount of salt that you eat to less than 2 grams per day.  · Reduce the amount of protein in your diet. Eat less meat, fish, nuts, and dairy.  · Avoid foods such as spinach, rhubarb, nuts, or bran. These may make kidney stones more likely to form.  SEEK MEDICAL CARE IF:  · You have a fever or chills.  · Your urine smells bad or looks cloudy.  · You have pain or burning when you pass urine.  SEEK IMMEDIATE MEDICAL CARE IF:  · Your flank pain or groin pain suddenly worsens.  · You become confused or disoriented or you lose consciousness.     This information is not intended to replace advice given to you by your health care provider. Make sure you discuss any questions you have with your health care provider.     Document Released: 11/19/2004 Document Revised: 03/02/2014 Document Reviewed: 12/20/2013  Elsevier Interactive Patient Education ©2016 [employer]

## 2015-04-30 NOTE — ED Notes (Signed)
Sudden onset of R side abd pain radiating to groin 1 hour ago. C/o nausea, no vomiting.

## 2015-05-02 LAB — URINE CULTURE: CULTURE: NO GROWTH

## 2016-12-12 IMAGING — CT CT ABD-PELV W/ CM
2 of 4 series · 17 of 46 positions shown, 19 images · IV contrast (omnipaque)
Comparison: None.

CLINICAL DATA: Right lower quadrant pain

EXAM:
CT ABDOMEN AND PELVIS WITH CONTRAST
TECHNIQUE: Multidetector CT imaging of the abdomen and pelvis was performed
using the standard protocol following bolus administration of
intravenous contrast.
CONTRAST:  50mL OMNIPAQUE IOHEXOL 300 MG/ML SOLN, 100mL OMNIPAQUE
IOHEXOL 300 MG/ML SOLN

[Series 2: abd_pel_with 5.0 b40f · axial · 0.87mm/px · z∈[-523,-68]mm · 14 of 101 slices shown, 16 images]
[im 5/101  soft-tissue]
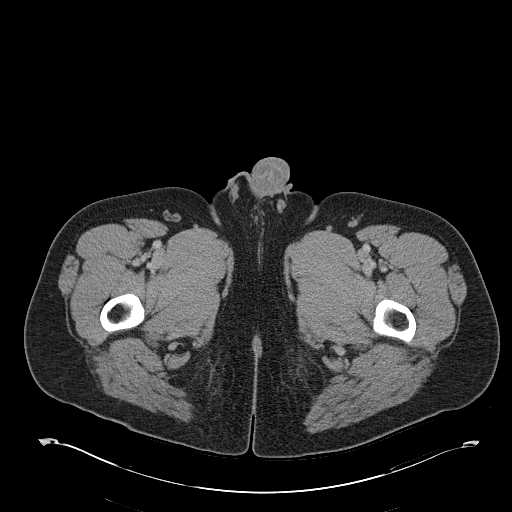
[im 5/101  bone]
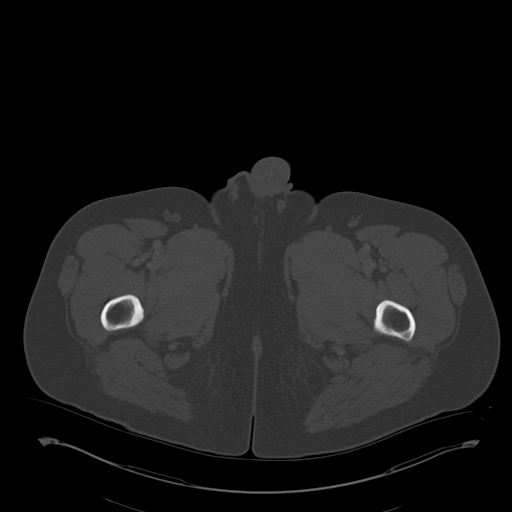
[im 14/101  soft-tissue]
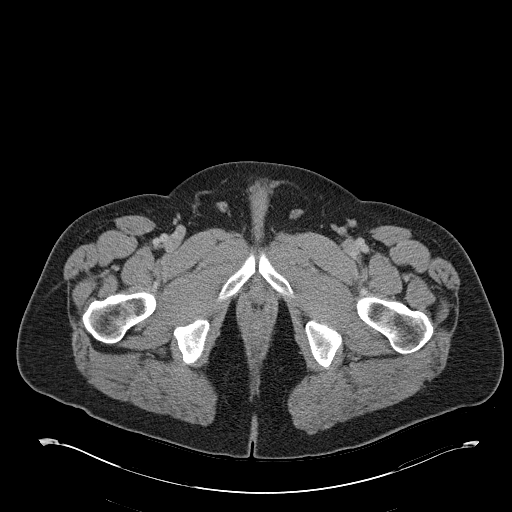
[im 18/101  soft-tissue]
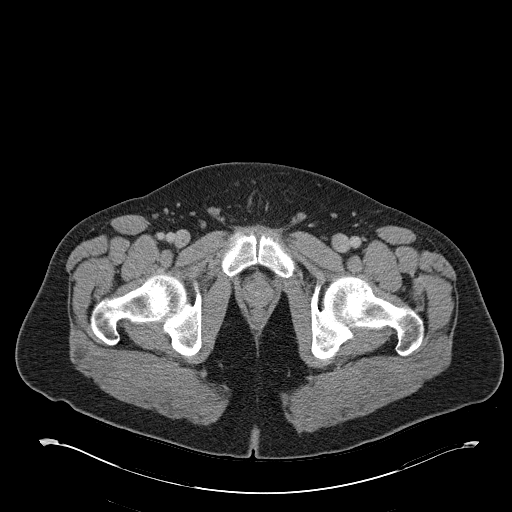
[im 27/101  soft-tissue]
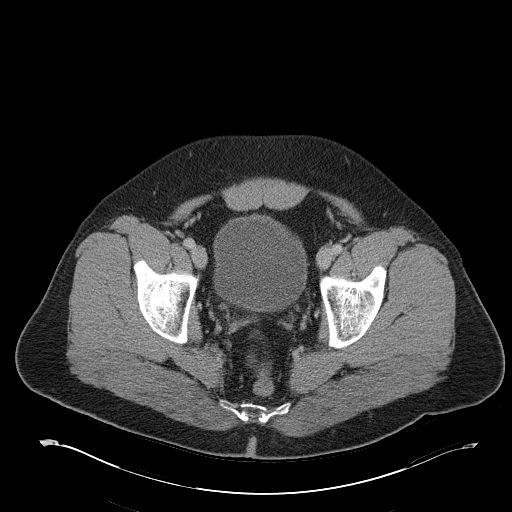
[im 35/101  soft-tissue]
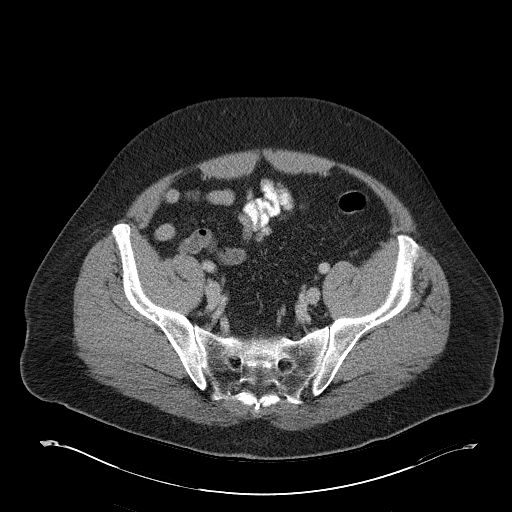
[im 40/101  soft-tissue]
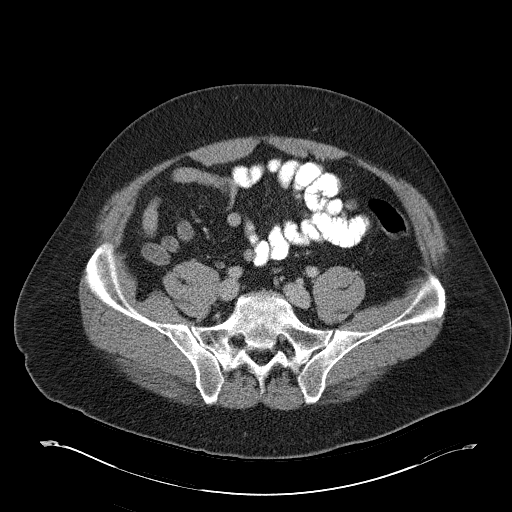
[im 48/101  soft-tissue]
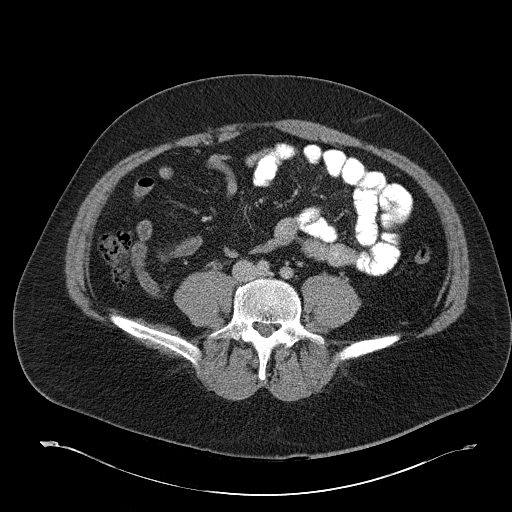
[im 53/101  soft-tissue]
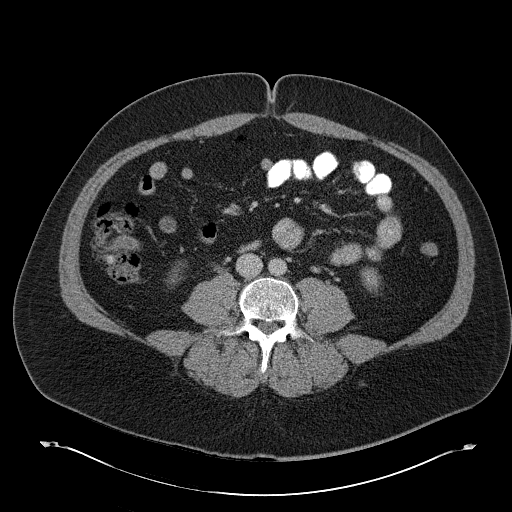
[im 61/101  soft-tissue]
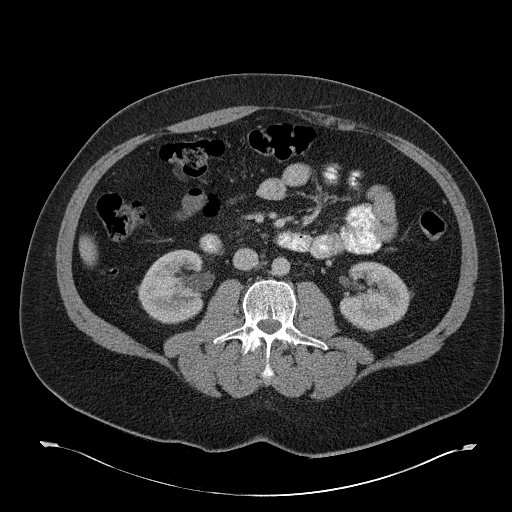
[im 61/101  bone]
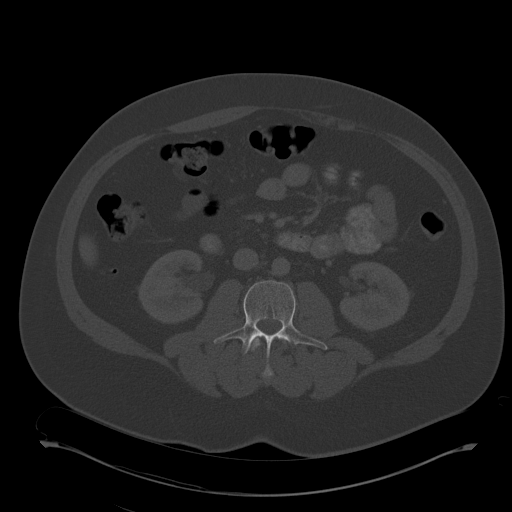
[im 66/101  soft-tissue]
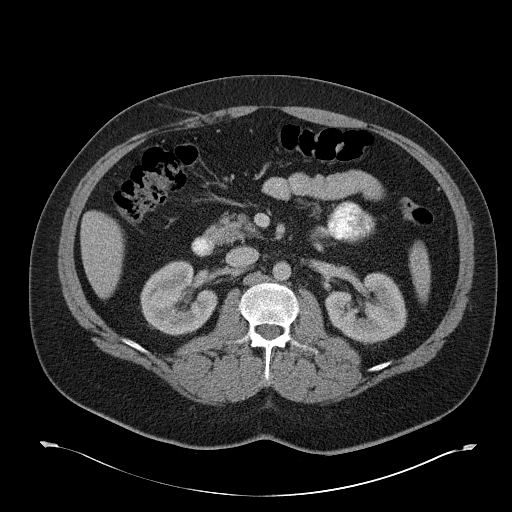
[im 74/101  soft-tissue]
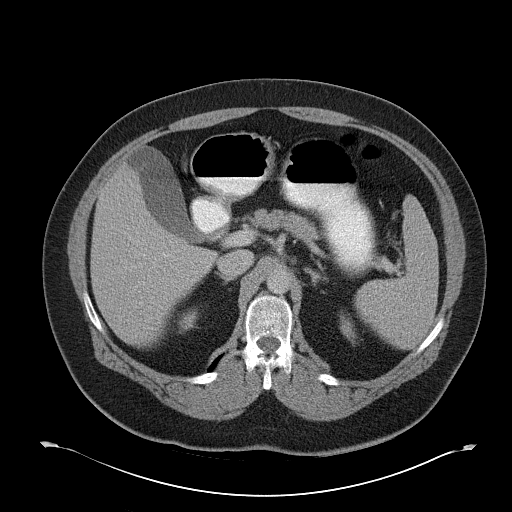
[im 83/101  soft-tissue]
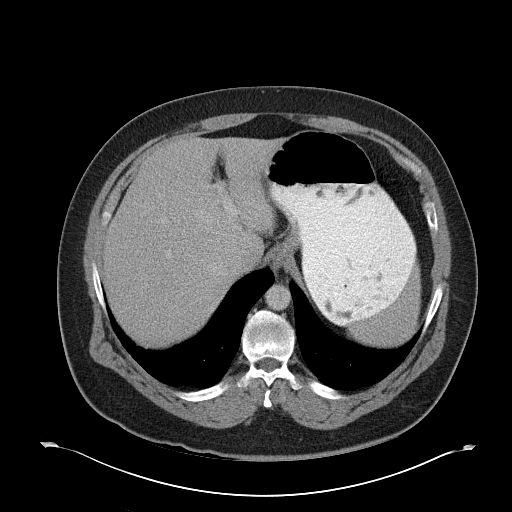
[im 87/101  soft-tissue]
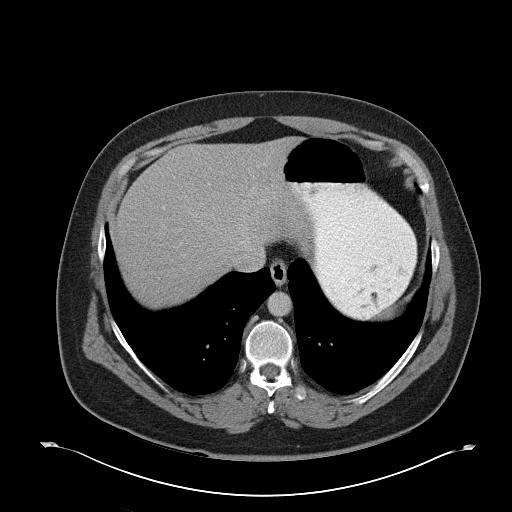
[im 96/101  soft-tissue]
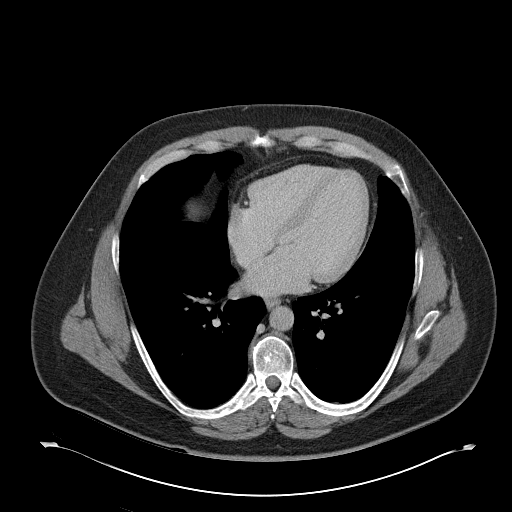

[Series 3: abd_pel_with 3.0 spo cor · coronal · 0.94mm/px · 3 of 109 slices shown]
[im 37/109  soft-tissue]
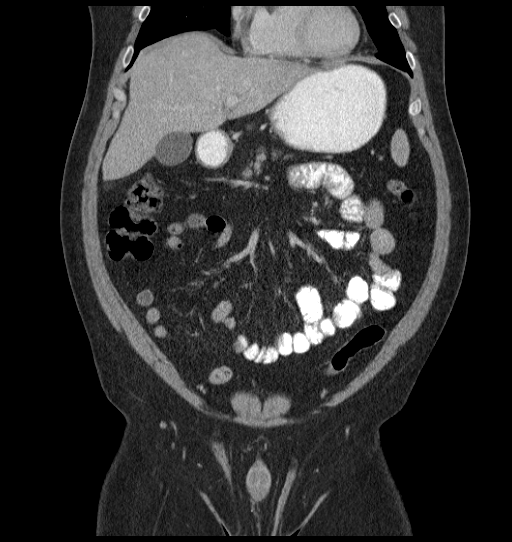
[im 49/109  soft-tissue]
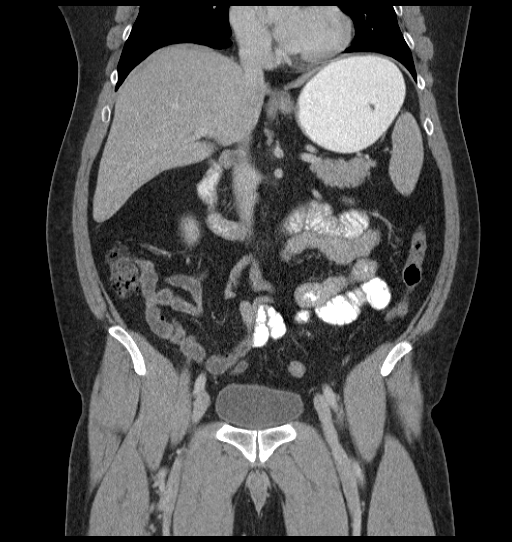
[im 61/109  soft-tissue]
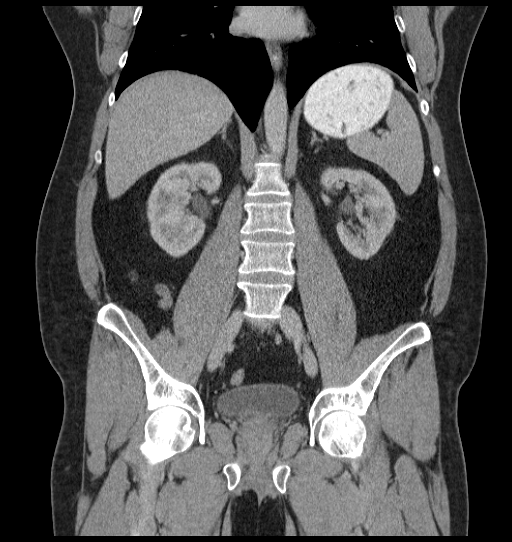

[17 of 46 positions shown; findings below may reference images not displayed]

FINDINGS: Lower chest:  Lung bases clear.  Heart size normal.

Hepatobiliary: Liver normal in size and contour. No focal liver
lesion. Gallbladder and bile ducts normal.

Pancreas: Negative

Spleen: Negative

Adrenals/Urinary Tract: Normal renal excretion of contrast
bilaterally. Negative for renal mass.

2 x 3 mm stone in the mid right ureter at the level of L4-5 causing
mild obstruction. No other renal calculi. Urinary bladder normal.

Stomach/Bowel: Negative for bowel obstruction. No bowel mass or
edema. Normal appendix. Mild sigmoid diverticulosis.

Vascular/Lymphatic: Normal aorta and iliac arteries. No aneurysm.
Negative for lymphadenopathy.

Reproductive: Normal prostate.

Other: No free fluid

Musculoskeletal: Negative
IMPRESSION: 2 x 3 mm stone right mid ureter causing mild obstruction.

Normal appendix.

## 2019-06-23 ENCOUNTER — Ambulatory Visit (INDEPENDENT_AMBULATORY_CARE_PROVIDER_SITE_OTHER): Payer: Self-pay

## 2019-06-23 ENCOUNTER — Ambulatory Visit
Admission: EM | Admit: 2019-06-23 | Discharge: 2019-06-23 | Disposition: A | Discharge status: Home / Self Care | Payer: Self-pay | Attending: Emergency Medicine | Admitting: Emergency Medicine

## 2019-06-23 DIAGNOSIS — S62619A Displaced fracture of proximal phalanx of unspecified finger, initial encounter for closed fracture: Secondary | ICD-10-CM

## 2019-06-23 DIAGNOSIS — Z23 Encounter for immunization: Secondary | ICD-10-CM

## 2019-06-23 DIAGNOSIS — S61218A Laceration without foreign body of other finger without damage to nail, initial encounter: Secondary | ICD-10-CM

## 2019-06-23 DIAGNOSIS — S61211A Laceration without foreign body of left index finger without damage to nail, initial encounter: Secondary | ICD-10-CM

## 2019-06-23 MED ORDER — DOXYCYCLINE HYCLATE 100 MG PO CAPS: 100.0000 mg | ORAL_CAPSULE | Freq: Two times a day (BID) | ORAL | 0 refills | Status: DC

## 2019-06-23 MED ORDER — TETANUS-DIPHTH-ACELL PERTUSSIS 5-2.5-18.5 LF-MCG/0.5 IM SUSP
0.5000 mL | Freq: Once | INTRAMUSCULAR | Status: AC
  Administered 2019-06-23: 0.5 mL via INTRAMUSCULAR

## 2019-06-23 MED ORDER — TRAMADOL HCL 50 MG PO TABS: 50.0000 mg | ORAL_TABLET | Freq: Two times a day (BID) | ORAL | 0 refills | Status: DC | PRN

## 2019-06-23 NOTE — Discharge Instructions (Addendum)
X-ray concerning for fracture a base of second proximal phalanx Finger splint applied Bandage applied Keep covered for next and dry for next 24-48 hours.  After then you may gently clean with warm water and mild soap.  Avoid submerging wound in water. Change dressing daily and apply a thin layer of neosporin.  Doxycycline prescribed.  Take as directed and to completion Return in 10 days to have sutures removed.   Take OTC ibuprofen or tylenol as needed for pain relief  Follow up with orthopedist next week for recheck and possible repeat x-ray Return sooner or go to the ED if you have any new or worsening symptoms such as increased pain, redness, swelling, drainage, discharge, decreased range of motion of extremity, etc..

## 2019-06-23 NOTE — ED Triage Notes (Signed)
Pt presents with complaints of cutting his left pointer finger on a knife while trying to get into something.

## 2019-06-23 NOTE — ED Provider Notes (Signed)
[facility]   [id number] 06/23/19 Arrival Time: 84  CC: LACERATION  SUBJECTIVE:  Billy Owens is a 44 y.o. male who presents with a laceration that occurred 1 hour ago.  Symptoms began after cutting top of LT index finger.  Bleeding controlled.  Currently not on blood thinners.  Denies similar symptoms in the past.  Denies fever, chills, nausea, vomiting, redness, swelling, purulent drainage, decrease strength or sensation.   Td UTD: Unknown.  ROS: As per HPI.  All other pertinent ROS negative.     History reviewed. No pertinent past medical history. History reviewed. No pertinent surgical history. No Known Allergies No current facility-administered medications on file prior to encounter.   Current Outpatient Medications on File Prior to Encounter  Medication Sig Dispense Refill  . acetaminophen (TYLENOL) 325 MG tablet Take 650 mg by mouth every 6 (six) hours as needed.    . nicotine (NICODERM CQ - DOSED IN MG/24 HOURS) 14 mg/24hr patch Place 14 mg onto the skin daily.    . [DISCONTINUED] albuterol (PROVENTIL HFA;VENTOLIN HFA) 108 (90 BASE) MCG/ACT inhaler Inhale 2 puffs into the lungs every 4 (four) hours as needed for wheezing or shortness of breath. 1 Inhaler 0   Social History   Socioeconomic History  . Marital status: Single    Spouse name: Not on file  . Number of children: Not on file  . Years of education: Not on file  . Highest education level: Not on file  Occupational History  . Not on file  Tobacco Use  . Smoking status: Current Every Day Smoker    Packs/day: 1.00    Years: 1.00    Pack years: 1.00    Types: Cigarettes  . Smokeless tobacco: Never Used  Substance and Sexual Activity  . Alcohol use: Yes    Comment: occ  . Drug use: Yes    Types: Marijuana  . Sexual activity: Not on file  Other Topics Concern  . Not on file  Social History Narrative  . Not on file   Social Determinants of Health   Financial Resource Strain:   .  Difficulty of Paying Living Expenses:   Food Insecurity:   . Worried About Charity fundraiser in the Last Year:   . Arboriculturist in the Last Year:   Transportation Needs:   . Film/video editor (Medical):   Marland Kitchen Lack of Transportation (Non-Medical):   Physical Activity:   . Days of Exercise per Week:   . Minutes of Exercise per Session:   Stress:   . Feeling of Stress :   Social Connections:   . Frequency of Communication with Friends and Family:   . Frequency of Social Gatherings with Friends and Family:   . Attends Religious Services:   . Active Member of Clubs or Organizations:   . Attends Archivist Meetings:   Marland Kitchen Marital Status:   Intimate Partner Violence:   . Fear of Current or Ex-Partner:   . Emotionally Abused:   Marland Kitchen Physically Abused:   . Sexually Abused:    Family History  Problem Relation Age of Onset  . Healthy Mother   . Heart failure Father   . Diabetes Father   . Stroke Father   . Heart attack Father      OBJECTIVE:  Vitals:   06/23/19 1236  BP: 119/76  Pulse: 91  Resp: 16  Temp: 98.2 F (36.8 C)  TempSrc: Oral  SpO2: 97%  General appearance: alert; no distress CV: radial pulse 2+ Strength: Intact Skin: two lacerations of left second digit, dorsal aspect to medial and lateral aspects; size: lateral laceration apx 1 cm, and medial laceration 0.5 cm, bleeding controlled Psychological: alert and cooperative; normal mood and affect  Procedure: Verbal consent obtained. Patient provided with risks and alternatives to the procedure. Wound copiously irrigated with NS then cleansed with betadine. Anesthetized with 5 mL of lidocaine without epinephrine after LET. Wound carefully explored. No foreign body, tendon injury, or nonviable tissue were noted. Using sterile technique 2 interrupted 4-0 and 2 horizontal Prolene sutures were placed to reapproximate the wound. Patient tolerated procedure well. No complications. Minimal bleeding. Patient  advised to look for and return for any signs of infection such as redness, swelling, discharge, or worsening pain. Return for suture removal in 10 days.  DIAGNOSTIC STUDIES:  DG Hand Complete Left  Result Date: 06/23/2019 CLINICAL DATA:  Laceration left second finger. EXAM: LEFT HAND - COMPLETE 3+ VIEW COMPARISON:  None. FINDINGS: Minimal degenerative change of the radiocarpal joint, radial side of the carpal bones and 1/2 carpometacarpal joints. No focal bony erosions. Subtle focal cortical regularity along the radial base of the second proximal phalanx which may be due to a subtle fracture. Suggestion of subtle adjacent soft tissue laceration. IMPRESSION: Possible subtle fracture along the radial base of the second proximal phalanx. Electronically Signed   By: Elberta Fortis M.D.   On: 06/23/2019 12:57    X-rays negative for possible subtle fracture base of second proximal phalanx  I have reviewed the x-rays myself and the radiologist interpretation. I am in agreement with the radiologist interpretation.      ASSESSMENT & PLAN:  1. Laceration of left index finger without foreign body without damage to nail, initial encounter   2. Fracture of proximal phalanx of digit of hand     Meds ordered this encounter  Medications  . doxycycline (VIBRAMYCIN) 100 MG capsule    Sig: Take 1 capsule (100 mg total) by mouth 2 (two) times daily.    Dispense:  20 capsule    Refill:  0    Order Specific Question:   Supervising Provider    Answer:   Eustace Moore [[id number]]   X-ray concerning for fracture a base of second proximal phalanx Finger splint applied Bandage applied Keep covered and dry for next 24-48 hours.  After then you may gently clean with warm water and mild soap.  Avoid submerging wound in water. Change dressing daily and apply a thin layer of neosporin.  Doxycycline prescribed.  Take as directed and to completion Return in 10 days to have sutures removed.   Take OTC ibuprofen or  tylenol as needed for pain releif Return sooner or go to the ED if you have any new or worsening symptoms such as increased pain, redness, swelling, drainage, discharge, decreased range of motion of extremity, etc..    Discussed patient case with Dr. Leonides Grills and ortho pa on call. PA on call recommended splint, antibiotic and close follow up with ortho.  Patient made aware and will follow up with ortho for further evaluation and management    Reviewed expectations re: course of current medical issues. Questions answered. Outlined signs and symptoms indicating need for more acute intervention. Patient verbalized understanding. After Visit Summary given.   Rennis Harding, PA-C 06/23/19 1446

## 2019-09-29 ENCOUNTER — Ambulatory Visit
Admission: EM | Admit: 2019-09-29 | Discharge: 2019-09-29 | Disposition: A | Discharge status: Home / Self Care | Payer: Self-pay | Attending: Emergency Medicine | Admitting: Emergency Medicine

## 2019-09-29 ENCOUNTER — Other Ambulatory Visit: Payer: Self-pay

## 2019-09-29 DIAGNOSIS — K0889 Other specified disorders of teeth and supporting structures: Secondary | ICD-10-CM

## 2019-09-29 DIAGNOSIS — K029 Dental caries, unspecified: Secondary | ICD-10-CM

## 2019-09-29 MED ORDER — CHLORHEXIDINE GLUCONATE 0.12 % MT SOLN: 15.0000 mL | Freq: Two times a day (BID) | OROMUCOSAL | 0 refills | Status: AC

## 2019-09-29 MED ORDER — MELOXICAM 15 MG PO TABS: 15.0000 mg | ORAL_TABLET | Freq: Every day | ORAL | 0 refills | Status: AC

## 2019-09-29 MED ORDER — TRAMADOL HCL 50 MG PO TABS: 50.0000 mg | ORAL_TABLET | Freq: Two times a day (BID) | ORAL | 0 refills | Status: AC | PRN

## 2019-09-29 MED ORDER — AMOXICILLIN-POT CLAVULANATE 875-125 MG PO TABS: 1.0000 | ORAL_TABLET | Freq: Two times a day (BID) | ORAL | 0 refills | Status: AC

## 2019-09-29 NOTE — ED Triage Notes (Signed)
Pt reports left side tooth abscess

## 2019-09-29 NOTE — Discharge Instructions (Signed)
Augmentin prescribed.  Take as directed and to completion Mobic and peridex for pain Tramadol for severe break-through pain.  This medication may make you drowsy.  Do not take prior to driving or operating heavy machinery Recommend soft diet until evaluated by dentist Maintain oral hygiene care Follow up with dentist as soon as possible for further evaluation and treatment  Return or go to the ED if you have any new or worsening symptoms such as fever, chills, difficulty swallowing, painful swallowing, oral or neck swelling, nausea, vomiting, chest pain, SOB, etc..Marland Kitchen

## 2019-09-29 NOTE — ED Provider Notes (Signed)
[facility]   [id number] 09/29/19 Arrival Time: 1111  CC: DENTAL PAIN  SUBJECTIVE:  Billy Owens is a 44 y.o. male who presents with dental pain x 2-3 days.  Denies a precipitating event or trauma.  Localizes pain to LT side, top and bottom.  Has tried OTC analgesics without relief.  Worse with chewing.  Has appt with dentist next week.  Reports similar symptoms in the past.  Denies fever, chills, dysphagia, odynophagia, oral or neck swelling, nausea, vomiting, chest pain, SOB.    ROS: As per HPI.  All other pertinent ROS negative.     History reviewed. No pertinent past medical history. History reviewed. No pertinent surgical history. No Known Allergies No current facility-administered medications on file prior to encounter.   Current Outpatient Medications on File Prior to Encounter  Medication Sig Dispense Refill  . acetaminophen (TYLENOL) 325 MG tablet Take 650 mg by mouth every 6 (six) hours as needed.    . nicotine (NICODERM CQ - DOSED IN MG/24 HOURS) 14 mg/24hr patch Place 14 mg onto the skin daily.    . [DISCONTINUED] albuterol (PROVENTIL HFA;VENTOLIN HFA) 108 (90 BASE) MCG/ACT inhaler Inhale 2 puffs into the lungs every 4 (four) hours as needed for wheezing or shortness of breath. 1 Inhaler 0   Social History   Socioeconomic History  . Marital status: Single    Spouse name: Not on file  . Number of children: Not on file  . Years of education: Not on file  . Highest education level: Not on file  Occupational History  . Not on file  Tobacco Use  . Smoking status: Current Every Day Smoker    Packs/day: 1.00    Years: 1.00    Pack years: 1.00    Types: Cigarettes  . Smokeless tobacco: Never Used  Substance and Sexual Activity  . Alcohol use: Yes    Comment: occ  . Drug use: Yes    Types: Marijuana  . Sexual activity: Not on file  Other Topics Concern  . Not on file  Social History Narrative  . Not on file   Social Determinants of Health    Financial Resource Strain:   . Difficulty of Paying Living Expenses:   Food Insecurity:   . Worried About Programme researcher, broadcasting/film/video in the Last Year:   . Barista in the Last Year:   Transportation Needs:   . Freight forwarder (Medical):   Marland Kitchen Lack of Transportation (Non-Medical):   Physical Activity:   . Days of Exercise per Week:   . Minutes of Exercise per Session:   Stress:   . Feeling of Stress :   Social Connections:   . Frequency of Communication with Friends and Family:   . Frequency of Social Gatherings with Friends and Family:   . Attends Religious Services:   . Active Member of Clubs or Organizations:   . Attends Banker Meetings:   Marland Kitchen Marital Status:   Intimate Partner Violence:   . Fear of Current or Ex-Partner:   . Emotionally Abused:   Marland Kitchen Physically Abused:   . Sexually Abused:    Family History  Problem Relation Age of Onset  . Healthy Mother   . Heart failure Father   . Diabetes Father   . Stroke Father   . Heart attack Father     OBJECTIVE:  Vitals:   09/29/19 1131  BP: 116/80  Pulse: 73  Resp: 18  Temp: 98.6 F (37  C)  SpO2: 97%    General appearance: alert; no distress HENT: normocephalic; atraumatic; PERRL, EOMI grossly; dentition: fair; partial tooth avulsion top and bottom gums with dental caries without areas of fluctuance Neck: supple without LAD Lungs: normal respirations Skin: warm and dry Psychological: alert and cooperative; normal mood and affect  ASSESSMENT & PLAN:  1. Pain, dental   2. Dental caries     Meds ordered this encounter  Medications  . amoxicillin-clavulanate (AUGMENTIN) 875-125 MG tablet    Sig: Take 1 tablet by mouth every 12 (twelve) hours for 10 days.    Dispense:  20 tablet    Refill:  0    Order Specific Question:   Supervising Provider    Answer:   Eustace Moore [[id number]]  . meloxicam (MOBIC) 15 MG tablet    Sig: Take 1 tablet (15 mg total) by mouth daily.    Dispense:  30  tablet    Refill:  0    Order Specific Question:   Supervising Provider    Answer:   Eustace Moore [[id number]]  . chlorhexidine (PERIDEX) 0.12 % solution    Sig: Use as directed 15 mLs in the mouth or throat 2 (two) times daily.    Dispense:  473 mL    Refill:  0    Order Specific Question:   Supervising Provider    Answer:   Eustace Moore [[id number]]  . traMADol (ULTRAM) 50 MG tablet    Sig: Take 1 tablet (50 mg total) by mouth every 12 (twelve) hours as needed for severe pain.    Dispense:  10 tablet    Refill:  0    Order Specific Question:   Supervising Provider    Answer:   Eustace Moore [[id number]]    Augmentin prescribed.  Take as directed and to completion Mobic and peridex for pain Tramadol for severe break-through pain.  This medication may make you drowsy.  Do not take prior to driving or operating heavy machinery Recommend soft diet until evaluated by dentist Maintain oral hygiene care Follow up with dentist as soon as possible for further evaluation and treatment  Return or go to the ED if you have any new or worsening symptoms such as fever, chills, difficulty swallowing, painful swallowing, oral or neck swelling, nausea, vomiting, chest pain, SOB, etc...  Reviewed expectations re: course of current medical issues. Questions answered. Outlined signs and symptoms indicating need for more acute intervention. Patient verbalized understanding. After Visit Summary given.   Rennis Harding, PA-C 09/29/19 1147

## 2021-02-04 IMAGING — DX DG HAND COMPLETE 3+V*L*
3 series · 3 of 3 positions shown · non-contrast
Comparison: None.

CLINICAL DATA: Laceration left second finger.

EXAM:
LEFT HAND - COMPLETE 3+ VIEW

[hand pa]
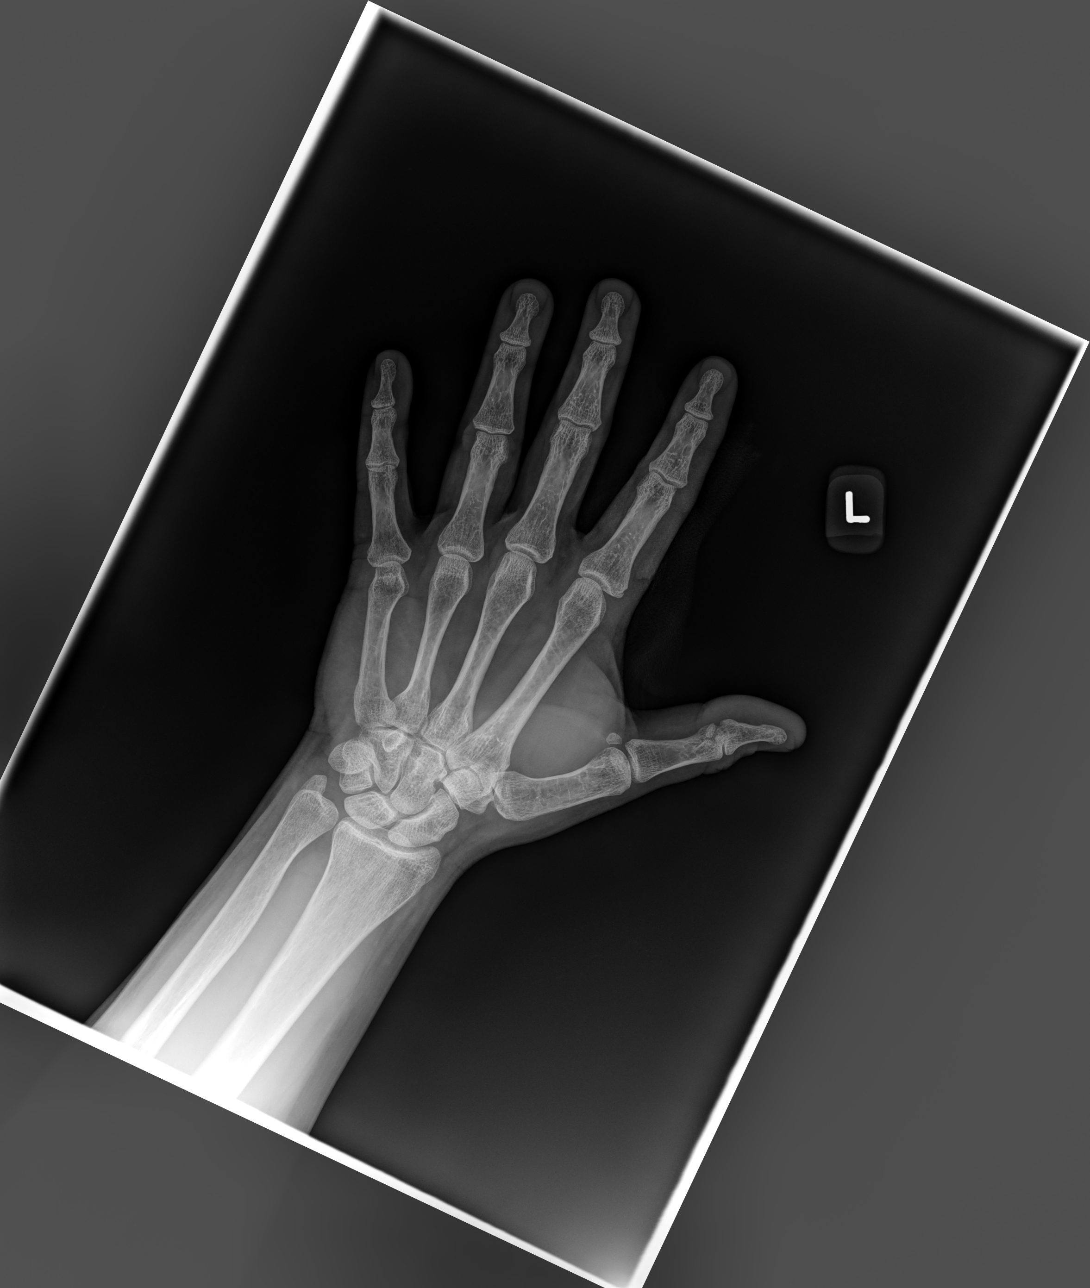

[hand mlo]
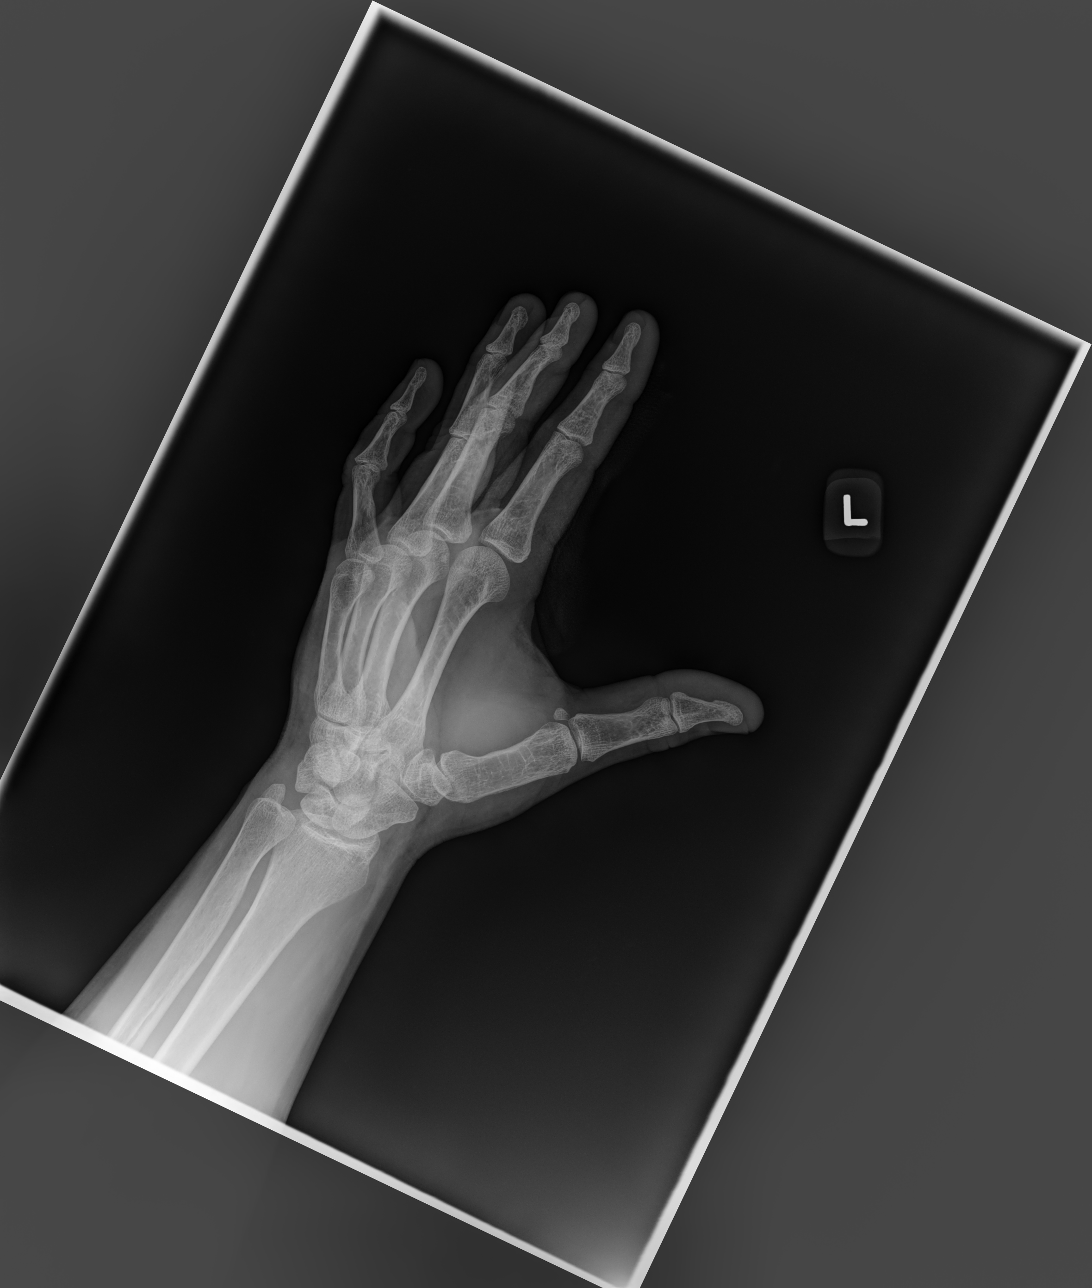

[hand lat]
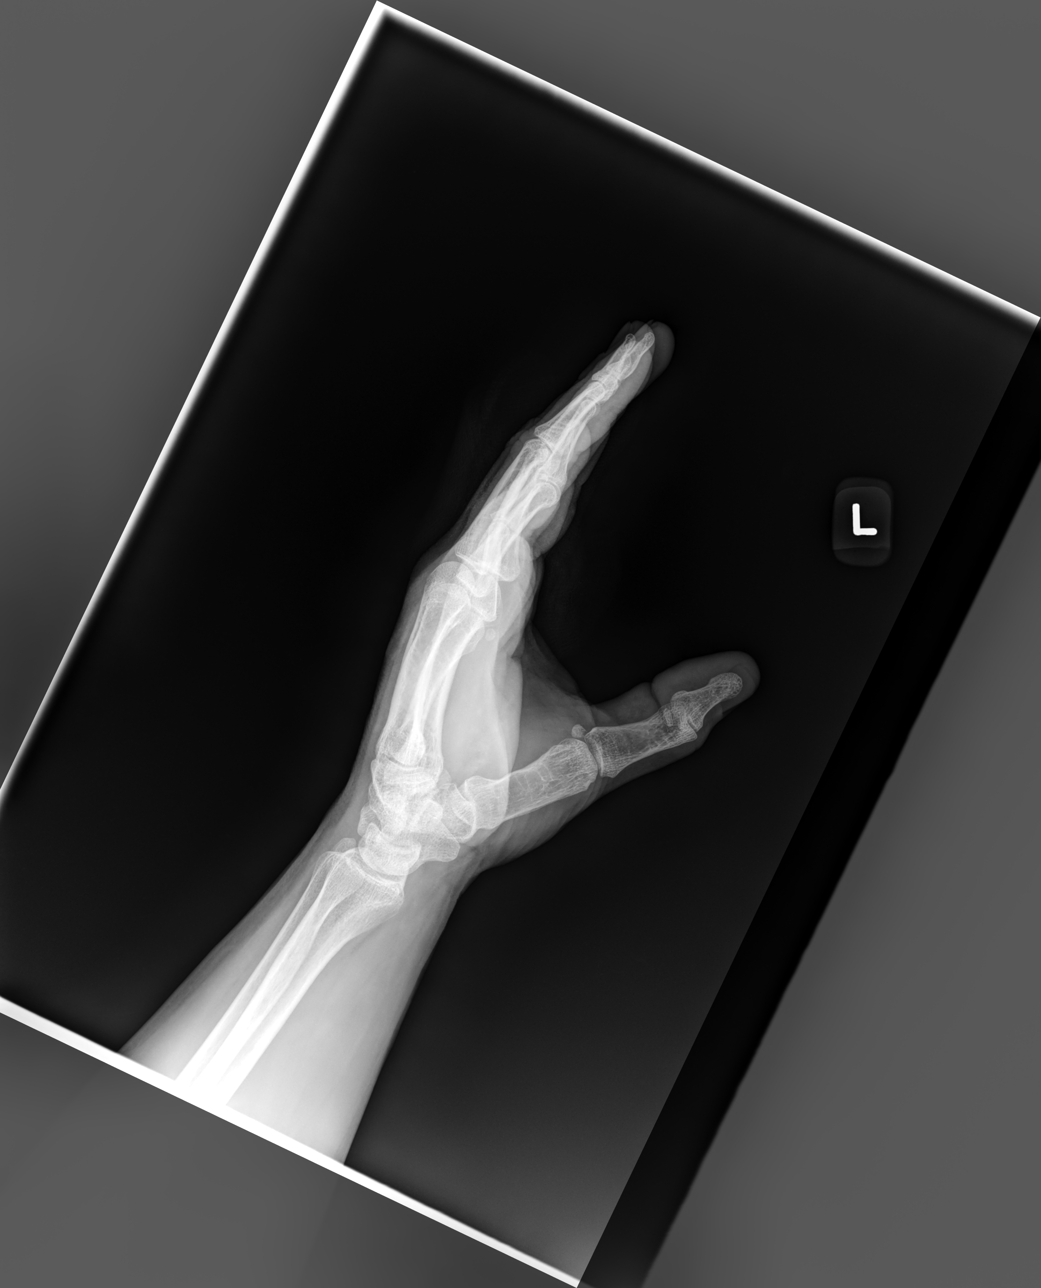

[3 of 3 positions shown; findings below may reference images not displayed]

FINDINGS: Minimal degenerative change of the radiocarpal joint, radial side of
the carpal bones and [DATE] carpometacarpal joints. No focal bony
erosions. Subtle focal cortical regularity along the radial base of
the second proximal phalanx which may be due to a subtle fracture.
Suggestion of subtle adjacent soft tissue laceration.
IMPRESSION: Possible subtle fracture along the radial base of the second
proximal phalanx.
